# Patient Record
Sex: Female | Born: 1995 | Race: Black or African American | Hispanic: No | Marital: Single | Smoking: Current some day smoker
Health system: Southern US, Community
[De-identification: ages and names within clinical notes are randomized; demographics above are authoritative.]

---

## 2016-09-12 ENCOUNTER — Emergency Department (HOSPITAL_COMMUNITY): Payer: No Typology Code available for payment source

## 2016-09-12 ENCOUNTER — Emergency Department (HOSPITAL_COMMUNITY)
Admission: EM | Admit: 2016-09-12 | Discharge: 2016-09-12 | Disposition: A | Discharge status: Home / Self Care | Payer: No Typology Code available for payment source | Attending: Emergency Medicine | Admitting: Emergency Medicine

## 2016-09-12 ENCOUNTER — Encounter (HOSPITAL_COMMUNITY): Payer: Self-pay | Admitting: Emergency Medicine

## 2016-09-12 DIAGNOSIS — Y93I9 Activity, other involving external motion: Secondary | ICD-10-CM | POA: Diagnosis not present

## 2016-09-12 DIAGNOSIS — S0990XA Unspecified injury of head, initial encounter: Secondary | ICD-10-CM

## 2016-09-12 DIAGNOSIS — Z23 Encounter for immunization: Secondary | ICD-10-CM | POA: Diagnosis not present

## 2016-09-12 DIAGNOSIS — M25559 Pain in unspecified hip: Secondary | ICD-10-CM

## 2016-09-12 DIAGNOSIS — S60812A Abrasion of left wrist, initial encounter: Secondary | ICD-10-CM | POA: Insufficient documentation

## 2016-09-12 DIAGNOSIS — Y999 Unspecified external cause status: Secondary | ICD-10-CM | POA: Diagnosis not present

## 2016-09-12 DIAGNOSIS — T07XXXA Unspecified multiple injuries, initial encounter: Secondary | ICD-10-CM

## 2016-09-12 DIAGNOSIS — M25551 Pain in right hip: Secondary | ICD-10-CM | POA: Diagnosis present

## 2016-09-12 DIAGNOSIS — S0181XA Laceration without foreign body of other part of head, initial encounter: Secondary | ICD-10-CM | POA: Insufficient documentation

## 2016-09-12 DIAGNOSIS — Y929 Unspecified place or not applicable: Secondary | ICD-10-CM | POA: Diagnosis not present

## 2016-09-12 LAB — CBC WITH DIFFERENTIAL/PLATELET
BASOS PCT: 1 %
Basophils Absolute: 0 10*3/uL (ref 0.0–0.1)
Eosinophils Absolute: 0.1 10*3/uL (ref 0.0–0.7)
Eosinophils Relative: 2 %
HEMATOCRIT: 34.9 % — AB (ref 36.0–46.0)
HEMOGLOBIN: 11 g/dL — AB (ref 12.0–15.0)
LYMPHS ABS: 1.6 10*3/uL (ref 0.7–4.0)
Lymphocytes Relative: 45 %
MCH: 28.5 pg (ref 26.0–34.0)
MCHC: 31.5 g/dL (ref 30.0–36.0)
MCV: 90.4 fL (ref 78.0–100.0)
MONOS PCT: 10 %
Monocytes Absolute: 0.3 10*3/uL (ref 0.1–1.0)
NEUTROS ABS: 1.5 10*3/uL — AB (ref 1.7–7.7)
NEUTROS PCT: 42 %
Platelets: 268 10*3/uL (ref 150–400)
RBC: 3.86 MIL/uL — ABNORMAL LOW (ref 3.87–5.11)
RDW: 11.9 % (ref 11.5–15.5)
WBC: 3.5 10*3/uL — ABNORMAL LOW (ref 4.0–10.5)

## 2016-09-12 LAB — COMPREHENSIVE METABOLIC PANEL
ALT: 11 U/L — ABNORMAL LOW (ref 14–54)
ANION GAP: 8 (ref 5–15)
AST: 28 U/L (ref 15–41)
Albumin: 4 g/dL (ref 3.5–5.0)
Alkaline Phosphatase: 40 U/L (ref 38–126)
BUN: 10 mg/dL (ref 6–20)
CHLORIDE: 106 mmol/L (ref 101–111)
CO2: 22 mmol/L (ref 22–32)
Calcium: 9.1 mg/dL (ref 8.9–10.3)
Creatinine, Ser: 0.78 mg/dL (ref 0.44–1.00)
GFR calc Af Amer: >60 mL/min (ref >60)
GFR calc non Af Amer: >60 mL/min (ref >60)
Glucose, Bld: 91 mg/dL (ref 65–99)
POTASSIUM: 3.3 mmol/L — AB (ref 3.5–5.1)
SODIUM: 136 mmol/L (ref 135–145)
Total Bilirubin: 0.5 mg/dL (ref 0.3–1.2)
Total Protein: 6.4 g/dL — ABNORMAL LOW (ref 6.5–8.1)

## 2016-09-12 LAB — I-STAT BETA HCG BLOOD, ED (MC, WL, AP ONLY): I-stat hCG, quantitative: <5 m[IU]/mL (ref <5)

## 2016-09-12 MED ORDER — HYDROCODONE-ACETAMINOPHEN 5-325 MG PO TABS: 2.0000 | ORAL_TABLET | ORAL | 0 refills | Status: AC | PRN

## 2016-09-12 MED ORDER — TETANUS-DIPHTH-ACELL PERTUSSIS 5-2.5-18.5 LF-MCG/0.5 IM SUSP
INTRAMUSCULAR | Status: AC
  Filled 2016-09-12: qty 0.5

## 2016-09-12 MED ORDER — IBUPROFEN 800 MG PO TABS: 800.0000 mg | ORAL_TABLET | Freq: Three times a day (TID) | ORAL | 0 refills | Status: AC

## 2016-09-12 MED ORDER — TETANUS-DIPHTH-ACELL PERTUSSIS 5-2.5-18.5 LF-MCG/0.5 IM SUSP
0.5000 mL | Freq: Once | INTRAMUSCULAR | Status: AC
  Administered 2016-09-12: 0.5 mL via INTRAMUSCULAR

## 2016-09-12 MED ORDER — SODIUM CHLORIDE 0.9 % IV BOLUS (SEPSIS)
1000.0000 mL | Freq: Once | INTRAVENOUS | Status: AC
  Administered 2016-09-12: 1000 mL via INTRAVENOUS

## 2016-09-12 NOTE — ED Notes (Signed)
Pt to CT via stretcher
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
## 2016-09-12 NOTE — ED Triage Notes (Signed)
Pt speaking on phone with mother Freddie Breech(Tanya Gross [phone]) chaplain at bedside
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
## 2016-09-12 NOTE — Discharge Instructions (Signed)
You will likely hurt for the better part of a week or longer, please rest for the next 48 hours then gradually get back into activity. You have no broken bones, the CT scan of her pelvis shows no broken bones or internal injuries, ibuprofen for pain, hydrocodone for severe pain. Please obtain all of your results from medical records or have your doctors office obtain the results - share them with your doctor - you should be seen at your doctors office in the next 2 days. Call today to arrange your follow up. Take the medications as prescribed. Please review all of the medicines and only take them if you do not have an allergy to them. Please be aware that if you are taking birth control pills, taking other prescriptions, ESPECIALLY ANTIBIOTICS may make the birth control ineffective - if this is the case, either do not engage in sexual activity or use alternative methods of birth control such as condoms until you have finished the medicine and your family doctor says it is OK to restart them. If you are on a blood thinner such as COUMADIN, be aware that any other medicine that you take may cause the coumadin to either work too much, or not enough - you should have your coumadin level rechecked in next 7 days if this is the case.  ?  It is also a possibility that you have an allergic reaction to any of the medicines that you have been prescribed - Everybody reacts differently to medications and while MOST people have no trouble with most medicines, you may have a reaction such as nausea, vomiting, rash, swelling, shortness of breath. If this is the case, please stop taking the medicine immediately and contact your physician.  ?  You should return to the ER if you develop severe or worsening symptoms.
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
## 2016-09-12 NOTE — ED Notes (Signed)
This RN spoke with Mother via telephone, reassurance given.

## 2016-09-12 NOTE — ED Notes (Signed)
Assisted pt getting up. Pt did well sitting up and sitting on the side of the bed. When Pt stood and put pressure on her right foot she started to cry and bent down in pain. Attempted to help pt walk again but pt could not put any pressure on the right foot. Pt asked if she could lay back down. Assisted Pt back in bed.
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
